# Patient Record
Sex: Female | Born: 1965 | Race: Black or African American | Hispanic: No | Marital: Married | Smoking: Current some day smoker
Health system: Southern US, Community
[De-identification: ages and names within clinical notes are randomized; demographics above are authoritative.]

## PROBLEM LIST (undated history)

## (undated) DIAGNOSIS — E119 Type 2 diabetes mellitus without complications: Secondary | ICD-10-CM

## (undated) DIAGNOSIS — I10 Essential (primary) hypertension: Secondary | ICD-10-CM

---

## 2016-02-03 ENCOUNTER — Emergency Department (HOSPITAL_COMMUNITY)
Admission: EM | Admit: 2016-02-03 | Discharge: 2016-02-03 | Disposition: A | Discharge status: Home / Self Care | Payer: Self-pay | Attending: Emergency Medicine | Admitting: Emergency Medicine

## 2016-02-03 ENCOUNTER — Encounter (HOSPITAL_COMMUNITY): Payer: Self-pay | Admitting: Emergency Medicine

## 2016-02-03 ENCOUNTER — Emergency Department (HOSPITAL_COMMUNITY): Payer: Self-pay

## 2016-02-03 DIAGNOSIS — K8020 Calculus of gallbladder without cholecystitis without obstruction: Secondary | ICD-10-CM

## 2016-02-03 DIAGNOSIS — I10 Essential (primary) hypertension: Secondary | ICD-10-CM | POA: Insufficient documentation

## 2016-02-03 DIAGNOSIS — K439 Ventral hernia without obstruction or gangrene: Secondary | ICD-10-CM

## 2016-02-03 DIAGNOSIS — D259 Leiomyoma of uterus, unspecified: Secondary | ICD-10-CM

## 2016-02-03 DIAGNOSIS — R1030 Lower abdominal pain, unspecified: Secondary | ICD-10-CM

## 2016-02-03 DIAGNOSIS — F17200 Nicotine dependence, unspecified, uncomplicated: Secondary | ICD-10-CM | POA: Insufficient documentation

## 2016-02-03 DIAGNOSIS — Z7982 Long term (current) use of aspirin: Secondary | ICD-10-CM | POA: Insufficient documentation

## 2016-02-03 DIAGNOSIS — N3000 Acute cystitis without hematuria: Secondary | ICD-10-CM

## 2016-02-03 DIAGNOSIS — Z7984 Long term (current) use of oral hypoglycemic drugs: Secondary | ICD-10-CM | POA: Insufficient documentation

## 2016-02-03 DIAGNOSIS — E119 Type 2 diabetes mellitus without complications: Secondary | ICD-10-CM | POA: Insufficient documentation

## 2016-02-03 HISTORY — DX: Essential (primary) hypertension: I10

## 2016-02-03 HISTORY — DX: Type 2 diabetes mellitus without complications: E11.9

## 2016-02-03 LAB — COMPREHENSIVE METABOLIC PANEL
ALBUMIN: 4 g/dL (ref 3.5–5.0)
ALT: 17 U/L (ref 14–54)
AST: 17 U/L (ref 15–41)
Alkaline Phosphatase: 49 U/L (ref 38–126)
Anion gap: 10 (ref 5–15)
BILIRUBIN TOTAL: 0.9 mg/dL (ref 0.3–1.2)
BUN: 15 mg/dL (ref 6–20)
CHLORIDE: 99 mmol/L — AB (ref 101–111)
CO2: 23 mmol/L (ref 22–32)
CREATININE: 0.7 mg/dL (ref 0.44–1.00)
Calcium: 9.2 mg/dL (ref 8.9–10.3)
GFR calc Af Amer: >60 mL/min (ref >60)
GLUCOSE: 181 mg/dL — AB (ref 65–99)
POTASSIUM: 3.5 mmol/L (ref 3.5–5.1)
Sodium: 132 mmol/L — ABNORMAL LOW (ref 135–145)
TOTAL PROTEIN: 7.5 g/dL (ref 6.5–8.1)

## 2016-02-03 LAB — URINALYSIS, ROUTINE W REFLEX MICROSCOPIC
Bilirubin Urine: NEGATIVE
Glucose, UA: NEGATIVE mg/dL
KETONES UR: 5 mg/dL — AB
Nitrite: NEGATIVE
PROTEIN: 100 mg/dL — AB
Specific Gravity, Urine: 1.02 (ref 1.005–1.030)
pH: 6 (ref 5.0–8.0)

## 2016-02-03 LAB — CBC
HEMATOCRIT: 34.4 % — AB (ref 36.0–46.0)
Hemoglobin: 11.6 g/dL — ABNORMAL LOW (ref 12.0–15.0)
MCH: 27.4 pg (ref 26.0–34.0)
MCHC: 33.7 g/dL (ref 30.0–36.0)
MCV: 81.1 fL (ref 78.0–100.0)
Platelets: 268 10*3/uL (ref 150–400)
RBC: 4.24 MIL/uL (ref 3.87–5.11)
RDW: 12.6 % (ref 11.5–15.5)
WBC: 19.5 10*3/uL — AB (ref 4.0–10.5)

## 2016-02-03 LAB — CBG MONITORING, ED: GLUCOSE-CAPILLARY: 190 mg/dL — AB (ref 65–99)

## 2016-02-03 LAB — LIPASE, BLOOD: LIPASE: 16 U/L (ref 11–51)

## 2016-02-03 MED ORDER — ONDANSETRON HCL 4 MG/2ML IJ SOLN
4.0000 mg | Freq: Once | INTRAMUSCULAR | Status: AC
  Administered 2016-02-03: 4 mg via INTRAVENOUS
  Filled 2016-02-03: qty 2

## 2016-02-03 MED ORDER — SODIUM CHLORIDE 0.9 % IJ SOLN
INTRAMUSCULAR | Status: AC
  Administered 2016-02-03: 17:00:00
  Filled 2016-02-03: qty 50

## 2016-02-03 MED ORDER — SODIUM CHLORIDE 0.9 % IV BOLUS (SEPSIS)
1000.0000 mL | Freq: Once | INTRAVENOUS | Status: AC
  Administered 2016-02-03: 1000 mL via INTRAVENOUS

## 2016-02-03 MED ORDER — SULFAMETHOXAZOLE-TRIMETHOPRIM 800-160 MG PO TABS: 1.0000 | ORAL_TABLET | Freq: Two times a day (BID) | ORAL | 0 refills | Status: AC

## 2016-02-03 MED ORDER — HYDROCODONE-ACETAMINOPHEN 5-325 MG PO TABS: 1.0000 | ORAL_TABLET | ORAL | 0 refills | Status: DC | PRN

## 2016-02-03 MED ORDER — SULFAMETHOXAZOLE-TRIMETHOPRIM 800-160 MG PO TABS
1.0000 | ORAL_TABLET | Freq: Once | ORAL | Status: AC
  Administered 2016-02-03: 1 via ORAL
  Filled 2016-02-03: qty 1

## 2016-02-03 MED ORDER — MORPHINE SULFATE (PF) 4 MG/ML IV SOLN
4.0000 mg | Freq: Once | INTRAVENOUS | Status: AC
  Administered 2016-02-03: 4 mg via INTRAVENOUS
  Filled 2016-02-03: qty 1

## 2016-02-03 MED ORDER — IOPAMIDOL (ISOVUE-300) INJECTION 61%
INTRAVENOUS | Status: AC
  Filled 2016-02-03: qty 100

## 2016-02-03 MED ORDER — IOPAMIDOL (ISOVUE-300) INJECTION 61%
100.0000 mL | Freq: Once | INTRAVENOUS | Status: AC | PRN
  Administered 2016-02-03: 100 mL via INTRAVENOUS

## 2016-02-03 MED ORDER — ACETAMINOPHEN 325 MG PO TABS
650.0000 mg | ORAL_TABLET | Freq: Once | ORAL | Status: AC
  Administered 2016-02-03: 650 mg via ORAL
  Filled 2016-02-03: qty 2

## 2016-02-03 NOTE — Progress Notes (Signed)
Pt confirms mark perini as pcp

## 2016-02-03 NOTE — ED Provider Notes (Signed)
[facility] DEPT Provider Note   CSN: [id number] Arrival date & time: 02/03/16  1150     History   Chief Complaint Chief Complaint  Patient presents with  . Cough  . Abdominal Pain    HPI Valerie Cervantes is a 50 y.o. female.  Pt presents to the ED today with abdominal pain.  She said she has pain on both sides of her belly.  She has not had n/v/d.  Last bm yesterday.      Past Medical History:  Diagnosis Date  . Diabetes mellitus without complication ([place])   . Hypertension     There are no active problems to display for this patient.   History reviewed. No pertinent surgical history.   Appendectomy  OB History    No data available       Home Medications    Prior to Admission medications   Medication Sig Start Date End Date Taking? Authorizing Provider  acetaminophen (TYLENOL) 500 MG tablet Take 1,000 mg by mouth every 6 (six) hours as needed for mild pain, moderate pain, fever or headache.   Yes Historical Provider, MD  amLODipine (NORVASC) 5 MG tablet Take 5 mg by mouth daily.   Yes Historical Provider, MD  aspirin EC 81 MG tablet Take 81 mg by mouth daily.   Yes Historical Provider, MD  glimepiride (AMARYL) 4 MG tablet Take 4 mg by mouth daily with breakfast.   Yes Historical Provider, MD  losartan (COZAAR) 50 MG tablet Take 50 mg by mouth daily.   Yes Historical Provider, MD  metFORMIN (GLUCOPHAGE) 1000 MG tablet Take 500 mg by mouth 2 (two) times daily with a meal.   Yes Historical Provider, MD  Multiple Vitamin (MULTIVITAMIN WITH MINERALS) TABS tablet Take 1 tablet by mouth daily.   Yes Historical Provider, MD  Oxycodone HCl 10 MG TABS Take 10 mg by mouth 3 (three) times daily as needed (for pain).   Yes Historical Provider, MD  valsartan (DIOVAN) 160 MG tablet Take 160 mg by mouth daily.   Yes Historical Provider, MD  HYDROcodone-acetaminophen (NORCO/VICODIN) 5-325 MG tablet Take 1 tablet by mouth every 4 (four) hours as needed. 02/03/16   Valerie Pence,  MD  sulfamethoxazole-trimethoprim (BACTRIM DS,SEPTRA DS) 800-160 MG tablet Take 1 tablet by mouth 2 (two) times daily. 02/03/16 02/10/16  Valerie Pence, MD    Family History History reviewed. No pertinent family history.  Social History Social History  Substance Use Topics  . Smoking status: Current Some Day Smoker  . Smokeless tobacco: Not on file  . Alcohol use No     Allergies   Patient has no known allergies.   Review of Systems Review of Systems  Constitutional: Positive for chills and fever.  Gastrointestinal: Positive for abdominal pain.  All other systems reviewed and are negative.    Physical Exam Updated Vital Signs BP 148/98   Pulse 104   Temp 99.4 F (37.4 C) (Oral)   Resp 18   Wt 185 lb (83.9 kg)   LMP 01/17/2016 (Approximate)   SpO2 97%   Physical Exam  Constitutional: She appears well-developed and well-nourished.  HENT:  Head: Normocephalic and atraumatic.  Right Ear: External ear normal.  Left Ear: External ear normal.  Nose: Nose normal.  Mouth/Throat: Oropharynx is clear and moist.  Eyes: Conjunctivae and EOM are normal. Pupils are equal, round, and reactive to light.  Neck: Normal range of motion. Neck supple.  Cardiovascular: Regular rhythm.  Tachycardia present.   Pulmonary/Chest: Effort normal  and breath sounds normal.  Abdominal: Soft. Bowel sounds are normal. There is tenderness in the right lower quadrant and left lower quadrant.  Musculoskeletal: Normal range of motion.  Neurological: She is alert.  Skin: Skin is warm.  Psychiatric: She has a normal mood and affect. Her behavior is normal. Thought content normal.  Nursing note and vitals reviewed.    ED Treatments / Results  Labs (all labs ordered are listed, but only abnormal results are displayed) Labs Reviewed  CBC - Abnormal; Notable for the following:       Result Value   WBC 19.5 (*)    Hemoglobin 11.6 (*)    HCT 34.4 (*)    All other components within normal limits   COMPREHENSIVE METABOLIC PANEL - Abnormal; Notable for the following:    Sodium 132 (*)    Chloride 99 (*)    Glucose, Bld 181 (*)    All other components within normal limits  URINALYSIS, ROUTINE W REFLEX MICROSCOPIC - Abnormal; Notable for the following:    APPearance CLOUDY (*)    Hgb urine dipstick SMALL (*)    Ketones, ur 5 (*)    Protein, ur 100 (*)    Leukocytes, UA MODERATE (*)    Bacteria, UA RARE (*)    Squamous Epithelial / LPF 6-30 (*)    All other components within normal limits  CBG MONITORING, ED - Abnormal; Notable for the following:    Glucose-Capillary 190 (*)    All other components within normal limits  LIPASE, BLOOD    EKG  EKG Interpretation  Date/Time:  Thursday February 03 2016 12:28:59 EST Ventricular Rate:  116 PR Interval:    QRS Duration: 68 QT Interval:  308 QTC Calculation: 428 R Axis:   82 Text Interpretation:  Sinus tachycardia Anterior infarct, old Confirmed by [facility] MD, Morrison Masser ([id number]) on 02/03/2016 4:16:29 PM       Radiology Dg Chest 2 View  Result Date: 02/03/2016 CLINICAL DATA:  Cough, chills EXAM: CHEST  2 VIEW COMPARISON:  None. FINDINGS: The heart size and mediastinal contours are within normal limits. Both lungs are clear. The visualized skeletal structures are unremarkable. IMPRESSION: No active cardiopulmonary disease. Electronically Signed   By: Kathreen Devoid   On: 02/03/2016 17:17   Ct Abdomen Pelvis W Contrast  Result Date: 02/03/2016 CLINICAL DATA:  50 y/o  F; lower abdominal pain. EXAM: CT ABDOMEN AND PELVIS WITH CONTRAST TECHNIQUE: Multidetector CT imaging of the abdomen and pelvis was performed using the standard protocol following bolus administration of intravenous contrast. CONTRAST:  16mL ISOVUE-300 IOPAMIDOL (ISOVUE-300) INJECTION 61% COMPARISON:  None. FINDINGS: Lower chest: No acute abnormality. Hepatobiliary: Focal fat along the falciform ligament. Few gallstones measuring up to 5 mm. No intrahepatic ductal  dilatation. Mild prominence of the common bile duct measuring up to 7 mm. Pancreas: Prominent pancreatic duct within normal limits. No surrounding inflammatory changes. Spleen: Normal in size without focal abnormality. Adrenals/Urinary Tract: Adrenal glands are unremarkable. Kidneys are normal, without renal calculi, focal lesion, or hydronephrosis. Bladder is unremarkable. Stomach/Bowel: Stomach is within normal limits. Appendix not visualized. No evidence of bowel wall thickening, distention, or inflammatory changes. Vascular/Lymphatic: No significant vascular findings are present. No enlarged abdominal or pelvic lymph nodes. Reproductive: Multiple uterine myomas the largest in the posterior lower uterine segment sub serosal measuring 35 mm. Adnexa are unremarkable. Other: Broad-based supraumbilical midline abdominal hernia with partial herniation of transverse colon without obstruction or inflammatory change. Broad-based midline infraumbilical anterior abdominal wall hernia  containing multiple loops of small bowel without obstructive or inflammatory change. No ascites. Musculoskeletal: Grade 1 L4-5 degenerative anterolisthesis. No acute osseous abnormality. IMPRESSION: 1. Broad-based small supraumbilical midline abdominal hernia with partial herniation of transverse colon without obstructive or inflammatory change. 2. Broad-based moderate infraumbilical midline abdominal hernia containing loops of small bowel without obstructive or inflammatory change. 3. Gallstones.  No secondary signs of cholecystitis. 4. Myomatous uterus. 5. Lumbar degenerative changes with grade 1 L4-5 anterolisthesis. Electronically Signed   By: Kristine Garbe M.D.   On: 02/03/2016 17:46    Procedures Procedures (including critical care time)  Medications Ordered in ED Medications  sulfamethoxazole-trimethoprim (BACTRIM DS,SEPTRA DS) 800-160 MG per tablet 1 tablet (not administered)  sodium chloride 0.9 % bolus 1,000 mL  (1,000 mLs Intravenous New Bag/Given 02/03/16 1658)  morphine 4 MG/ML injection 4 mg (4 mg Intravenous Given 02/03/16 1658)  ondansetron (ZOFRAN) injection 4 mg (4 mg Intravenous Given 02/03/16 1658)  acetaminophen (TYLENOL) tablet 650 mg (650 mg Oral Given 02/03/16 1658)  iopamidol (ISOVUE-300) 61 % injection 100 mL (100 mLs Intravenous Contrast Given 02/03/16 1710)  sodium chloride 0.9 % injection (  Given by Other 02/03/16 1715)     Initial Impression / Assessment and Plan / ED Course  I have reviewed the triage vital signs and the nursing notes.  Pertinent labs & imaging results that were available during my care of the patient were reviewed by me and considered in my medical decision making (see chart for details).  Clinical Course     Pt is feeling much better.  No pain around hernias.  No pain in RUQ.  Pt ok for d/c.  She knows to return if worse and to f/u with surgery.  Final Clinical Impressions(s) / ED Diagnoses   Final diagnoses:  Lower abdominal pain  Acute cystitis without hematuria  Calculus of gallbladder without cholecystitis without obstruction  Uterine leiomyoma, unspecified location  Ventral hernia without obstruction or gangrene    New Prescriptions New Prescriptions   HYDROCODONE-ACETAMINOPHEN (NORCO/VICODIN) 5-325 MG TABLET    Take 1 tablet by mouth every 4 (four) hours as needed.   SULFAMETHOXAZOLE-TRIMETHOPRIM (BACTRIM DS,SEPTRA DS) 800-160 MG TABLET    Take 1 tablet by mouth 2 (two) times daily.     Valerie Pence, MD 02/03/16 1800

## 2016-02-03 NOTE — ED Triage Notes (Addendum)
Pt reports cough, chills, and intermittent dizziness. Pt also having bilateral lower abd pain. No n/vd, dysuria, sore throat, runny nose, or CP. Has not taken any tylenol or ibuprofen this am.

## 2016-02-03 NOTE — ED Notes (Signed)
ED Provider at bedside. 

## 2016-02-03 NOTE — ED Notes (Signed)
Patient transported to X-ray 

## 2016-02-03 NOTE — ED Notes (Signed)
Patient is resting comfortably. 

## 2016-03-29 ENCOUNTER — Encounter (HOSPITAL_COMMUNITY): Payer: Self-pay

## 2016-03-29 ENCOUNTER — Emergency Department (HOSPITAL_COMMUNITY)
Admission: EM | Admit: 2016-03-29 | Discharge: 2016-03-29 | Disposition: A | Discharge status: Home / Self Care | Payer: Self-pay | Attending: Physician Assistant | Admitting: Physician Assistant

## 2016-03-29 DIAGNOSIS — Z7982 Long term (current) use of aspirin: Secondary | ICD-10-CM | POA: Insufficient documentation

## 2016-03-29 DIAGNOSIS — Z79899 Other long term (current) drug therapy: Secondary | ICD-10-CM | POA: Insufficient documentation

## 2016-03-29 DIAGNOSIS — E119 Type 2 diabetes mellitus without complications: Secondary | ICD-10-CM | POA: Insufficient documentation

## 2016-03-29 DIAGNOSIS — I10 Essential (primary) hypertension: Secondary | ICD-10-CM | POA: Insufficient documentation

## 2016-03-29 DIAGNOSIS — F17200 Nicotine dependence, unspecified, uncomplicated: Secondary | ICD-10-CM | POA: Insufficient documentation

## 2016-03-29 DIAGNOSIS — K2970 Gastritis, unspecified, without bleeding: Secondary | ICD-10-CM | POA: Insufficient documentation

## 2016-03-29 LAB — URINALYSIS, ROUTINE W REFLEX MICROSCOPIC
BILIRUBIN URINE: NEGATIVE
Bacteria, UA: NONE SEEN
Glucose, UA: NEGATIVE mg/dL
Hgb urine dipstick: NEGATIVE
KETONES UR: NEGATIVE mg/dL
Leukocytes, UA: NEGATIVE
Nitrite: NEGATIVE
PH: 8 (ref 5.0–8.0)
Protein, ur: 100 mg/dL — AB
SPECIFIC GRAVITY, URINE: 1.019 (ref 1.005–1.030)

## 2016-03-29 LAB — COMPREHENSIVE METABOLIC PANEL
ALT: 14 U/L (ref 14–54)
AST: 18 U/L (ref 15–41)
Albumin: 3.8 g/dL (ref 3.5–5.0)
Alkaline Phosphatase: 62 U/L (ref 38–126)
Anion gap: 12 (ref 5–15)
BUN: 7 mg/dL (ref 6–20)
CHLORIDE: 98 mmol/L — AB (ref 101–111)
CO2: 25 mmol/L (ref 22–32)
CREATININE: 0.55 mg/dL (ref 0.44–1.00)
Calcium: 9.7 mg/dL (ref 8.9–10.3)
GFR calc Af Amer: >60 mL/min (ref >60)
GFR calc non Af Amer: >60 mL/min (ref >60)
Glucose, Bld: 157 mg/dL — ABNORMAL HIGH (ref 65–99)
POTASSIUM: 4 mmol/L (ref 3.5–5.1)
SODIUM: 135 mmol/L (ref 135–145)
Total Bilirubin: 0.2 mg/dL — ABNORMAL LOW (ref 0.3–1.2)
Total Protein: 8 g/dL (ref 6.5–8.1)

## 2016-03-29 LAB — CBC
HCT: 38.7 % (ref 36.0–46.0)
Hemoglobin: 12.8 g/dL (ref 12.0–15.0)
MCH: 27.1 pg (ref 26.0–34.0)
MCHC: 33.1 g/dL (ref 30.0–36.0)
MCV: 82 fL (ref 78.0–100.0)
PLATELETS: 269 10*3/uL (ref 150–400)
RBC: 4.72 MIL/uL (ref 3.87–5.11)
RDW: 12.8 % (ref 11.5–15.5)
WBC: 6 10*3/uL (ref 4.0–10.5)

## 2016-03-29 LAB — POC URINE PREG, ED: PREG TEST UR: NEGATIVE

## 2016-03-29 LAB — LIPASE, BLOOD: LIPASE: 29 U/L (ref 11–51)

## 2016-03-29 MED ORDER — GI COCKTAIL ~~LOC~~
30.0000 mL | Freq: Once | ORAL | Status: AC
  Administered 2016-03-29: 30 mL via ORAL
  Filled 2016-03-29: qty 30

## 2016-03-29 MED ORDER — OMEPRAZOLE 20 MG PO CPDR: 20.0000 mg | DELAYED_RELEASE_CAPSULE | Freq: Every day | ORAL | 0 refills | Status: AC

## 2016-03-29 NOTE — ED Notes (Signed)
Pt ambulated to restroom from room, tolerated well. 

## 2016-03-29 NOTE — Discharge Instructions (Signed)
Please take your medications as prescribed. Avoid spicy foods, follow-up with your doctor for further evaluation and management of symptoms. Return to ED for new or worsening symptoms as we discussed

## 2016-03-29 NOTE — ED Triage Notes (Signed)
Pt presents with onset of L sided abdominal pain that began today.  +nausea and vomiting, denies any dysuria or vaginal discharge.

## 2016-03-29 NOTE — ED Provider Notes (Signed)
[facility] DEPT Provider Note   CSN: [id number] Arrival date & time: 03/29/16  1412     History   Chief Complaint Chief Complaint  Patient presents with  . Abdominal Pain    HPI Valerie Cervantes is a 51 y.o. female.  HPI history of diabetes and hypertension here for valuation of left-sided abdominal pain. Patient reports gradual onset left-sided abdominal pain that started this morning. She reports it feels like "labor pains". She reports 1 episode of nonbloody and nonbilious emesis. She did move her bowels today without difficulty. she denies any fevers, chills, chest pain, shortness of breath, back pain. Nothing tried to improve symptoms.   Past Medical History:  Diagnosis Date  . Diabetes mellitus without complication ([place])   . Hypertension     There are no active problems to display for this patient.   History reviewed. No pertinent surgical history.  OB History    No data available       Home Medications    Prior to Admission medications   Medication Sig Start Date End Date Taking? Authorizing Provider  amLODipine (NORVASC) 5 MG tablet Take 5 mg by mouth daily.   Yes Historical Provider, MD  aspirin EC 81 MG tablet Take 81 mg by mouth daily.   Yes Historical Provider, MD  Cholecalciferol 2000 units TABS Take 2,000 Units by mouth daily.   Yes Historical Provider, MD  glimepiride (AMARYL) 4 MG tablet Take 4 mg by mouth 2 (two) times daily.    Yes Historical Provider, MD  lisinopril (PRINIVIL,ZESTRIL) 20 MG tablet Take 20 mg by mouth daily.   Yes Historical Provider, MD  metFORMIN (GLUCOPHAGE) 1000 MG tablet Take 500 mg by mouth 2 (two) times daily with a meal.   Yes Historical Provider, MD  Omega-3 Fatty Acids (FISH OIL) 1000 MG CAPS Take 1,000 mg by mouth daily.   Yes Historical Provider, MD  Oxycodone HCl 10 MG TABS Take 10 mg by mouth 3 (three) times daily as needed (pain).   Yes Historical Provider, MD  omeprazole (PRILOSEC) 20 MG capsule Take 1 capsule (20 mg  total) by mouth daily. 03/29/16   Comer Locket, PA-C    Family History History reviewed. No pertinent family history.  Social History Social History  Substance Use Topics  . Smoking status: Current Some Day Smoker  . Smokeless tobacco: Never Used  . Alcohol use No     Allergies   Patient has no known allergies.   Review of Systems Review of Systems A 10 point review of systems was completed and was negative except for pertinent positives and negatives as mentioned in the history of present illness    Physical Exam Updated Vital Signs BP (!) 146/102 (BP Location: Right Arm)   Pulse 90   Temp 98.8 F (37.1 C) (Oral)   Resp 18   Ht 5\' 7"  (1.702 m)   Wt 81.6 kg   LMP 03/15/2016 (Approximate)   SpO2 99%   BMI 28.19 kg/m   Physical Exam  Constitutional: She appears well-developed. No distress.  Awake, alert and nontoxic in appearance  HENT:  Head: Normocephalic and atraumatic.  Right Ear: External ear normal.  Left Ear: External ear normal.  Mouth/Throat: Oropharynx is clear and moist.  Eyes: Conjunctivae and EOM are normal. Pupils are equal, round, and reactive to light.  Neck: Normal range of motion. No JVD present.  Cardiovascular: Normal rate, regular rhythm and normal heart sounds.   Pulmonary/Chest: Effort normal and breath sounds normal. No  stridor.  Abdominal: Soft. She exhibits no distension and no mass. There is no rebound and no guarding. No hernia.  Very mild tenderness in left upper quadrant  Musculoskeletal: Normal range of motion.  Neurological:  Awake, alert, cooperative and aware of situation; motor strength bilaterally; sensation normal to light touch bilaterally; no facial asymmetry; tongue midline; major cranial nerves appear intact;  baseline gait without new ataxia.  Skin: No rash noted. She is not diaphoretic.  Psychiatric: She has a normal mood and affect. Her behavior is normal. Thought content normal.  Nursing note and vitals  reviewed.    ED Treatments / Results  Labs (all labs ordered are listed, but only abnormal results are displayed) Labs Reviewed  COMPREHENSIVE METABOLIC PANEL - Abnormal; Notable for the following:       Result Value   Chloride 98 (*)    Glucose, Bld 157 (*)    Total Bilirubin 0.2 (*)    All other components within normal limits  URINALYSIS, ROUTINE W REFLEX MICROSCOPIC - Abnormal; Notable for the following:    Protein, ur 100 (*)    Squamous Epithelial / LPF 0-5 (*)    All other components within normal limits  LIPASE, BLOOD  CBC  POC URINE PREG, ED    EKG  EKG Interpretation  Date/Time:  Wednesday March 29 2016 16:59:39 EST Ventricular Rate:  93 PR Interval:    QRS Duration: 71 QT Interval:  343 QTC Calculation: 427 R Axis:   55 Text Interpretation:  Sinus rhythm Anteroseptal infarct, age indeterminate Normal sinus rhythm Confirmed by Gerald Leitz ([id number]) on 03/29/2016 5:12:19 PM       Radiology No results found.  Procedures Procedures (including critical care time)  Medications Ordered in ED Medications  gi cocktail (Maalox,Lidocaine,Donnatal) (30 mLs Oral Given 03/29/16 1656)     Initial Impression / Assessment and Plan / ED Course  I have reviewed the triage vital signs and the nursing notes.  Pertinent labs & imaging results that were available during my care of the patient were reviewed by me and considered in my medical decision making (see chart for details).     Labs, EKG and physical exam are all reassuring. Given GI cocktail with relief of symptoms. Husband at bedside reports that she eats spicy foods "all day". Suspect some level of gastritis. Counseled on diet. We'll discharge with PPI. Follow-up with PCP. Overall appears very well, nontoxic and appropriate for outpatient follow-up. Return precautions discussed Prior to patient discharge, I discussed and reviewed this case with Dr. Thomasene Lot      Final Clinical Impressions(s) / ED  Diagnoses   Final diagnoses:  Gastritis without bleeding, unspecified chronicity, unspecified gastritis type    New Prescriptions New Prescriptions   OMEPRAZOLE (PRILOSEC) 20 MG CAPSULE    Take 1 capsule (20 mg total) by mouth daily.     Comer Locket, PA-C 03/29/16 [place], MD 04/03/16 [phone]

## 2016-03-29 NOTE — ED Notes (Signed)
Wheeled pt back to room from waiting room. Pt placed in gown and on monitor.

## 2017-06-25 IMAGING — CT CT ABD-PELV W/ CM
2 of 5 series · 16 of 46 positions shown, 18 images · IV contrast (ISOVUE)
Comparison: None.

CLINICAL DATA: 49 y/o  F; lower abdominal pain.

EXAM:
CT ABDOMEN AND PELVIS WITH CONTRAST
TECHNIQUE: Multidetector CT imaging of the abdomen and pelvis was performed
using the standard protocol following bolus administration of
intravenous contrast.
CONTRAST:  100mL 2JALNQ-NFF IOPAMIDOL (2JALNQ-NFF) INJECTION 61%

[Series 2: abd/pel with · axial · 0.75mm/px · z∈[-941,-521]mm · 13 of 96 slices shown, 15 images]
[im 6/96  soft-tissue]
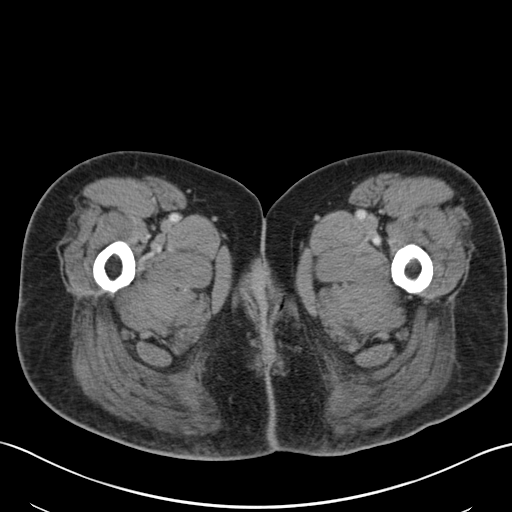
[im 6/96  bone]
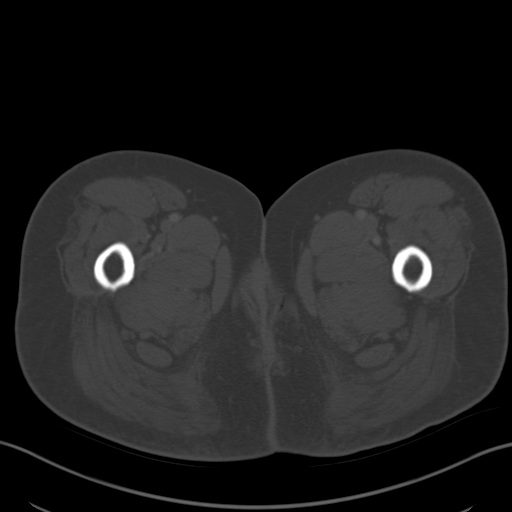
[im 12/96  soft-tissue]
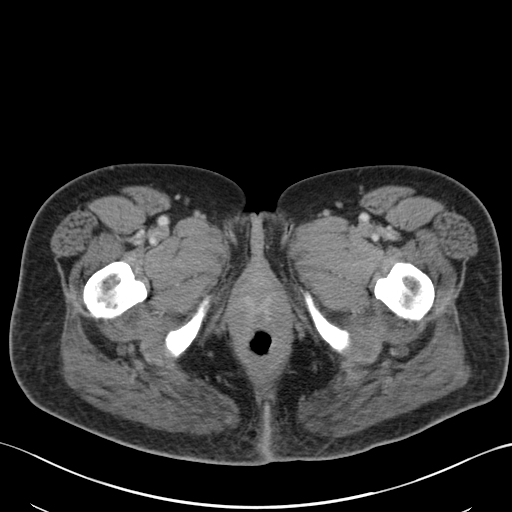
[im 18/96  soft-tissue]
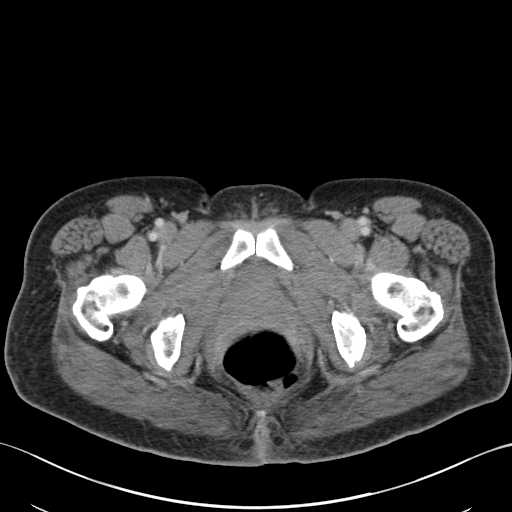
[im 30/96  soft-tissue]
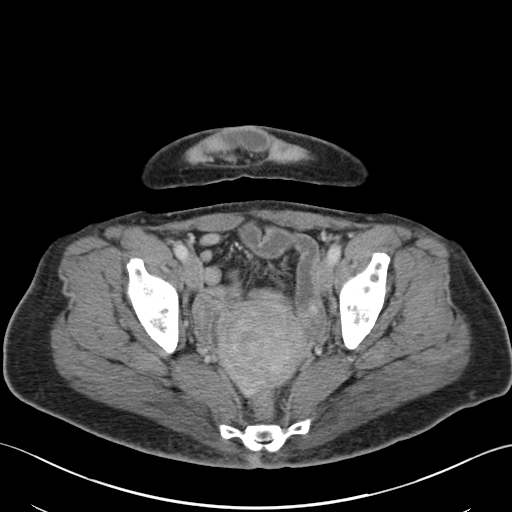
[im 36/96  soft-tissue]
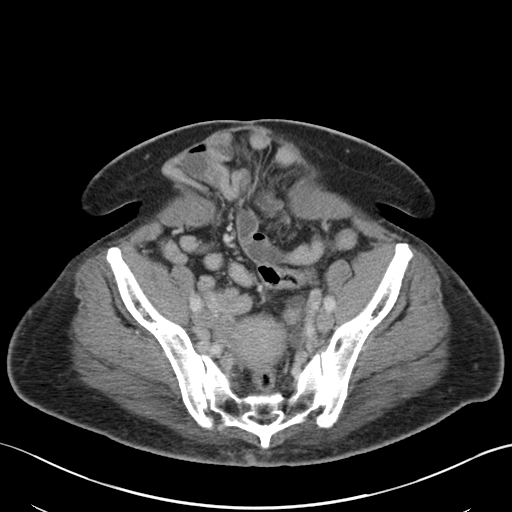
[im 42/96  soft-tissue]
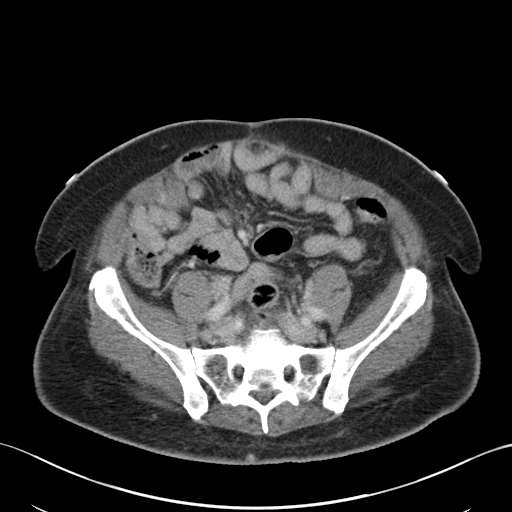
[im 48/96  soft-tissue]
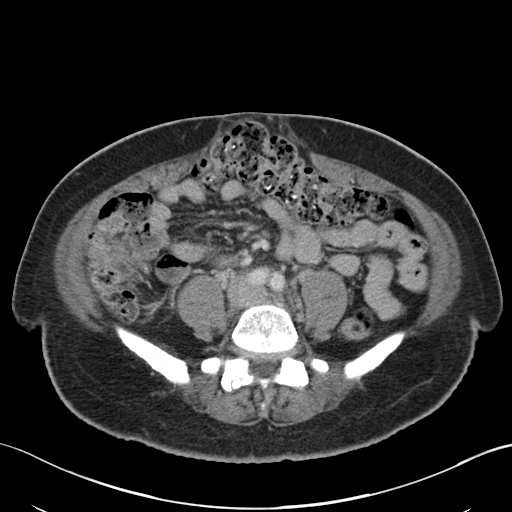
[im 54/96  soft-tissue]
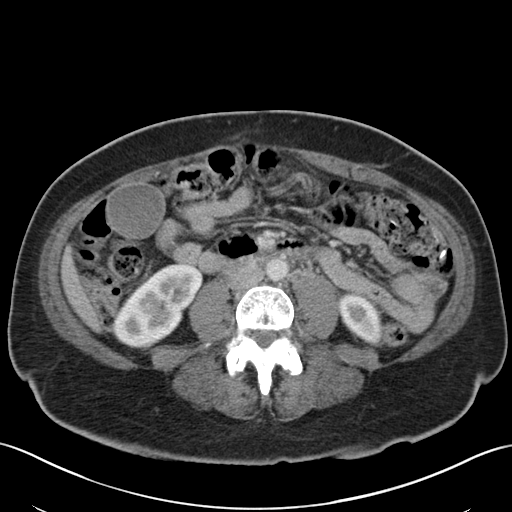
[im 60/96  soft-tissue]
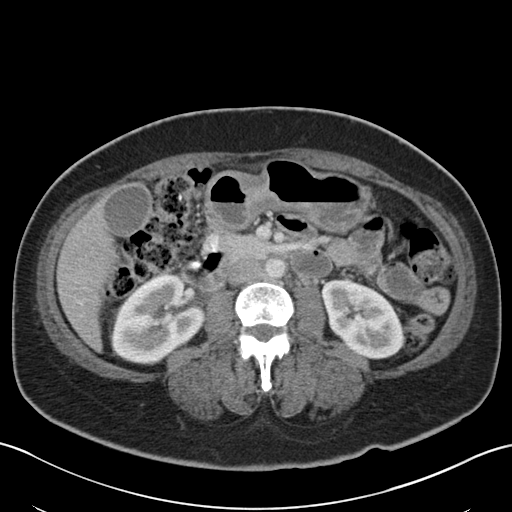
[im 60/96  bone]
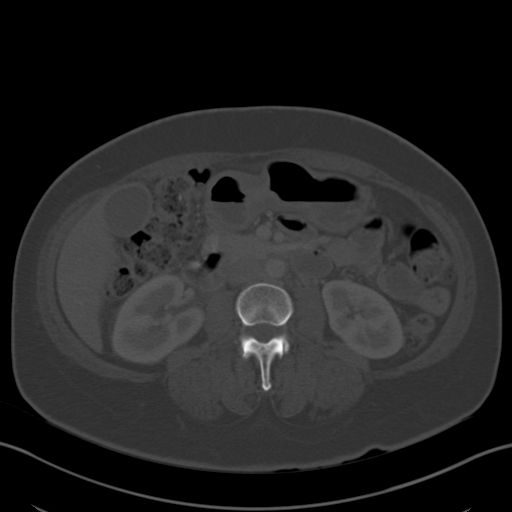
[im 66/96  soft-tissue]
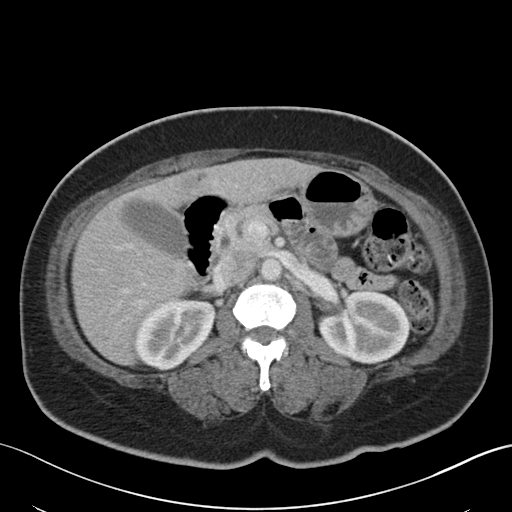
[im 78/96  soft-tissue]
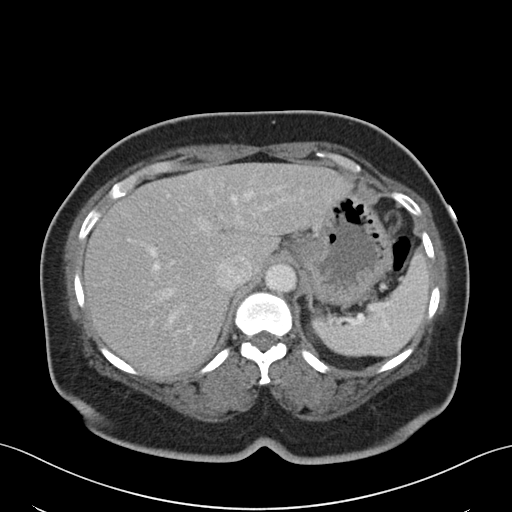
[im 84/96  soft-tissue]
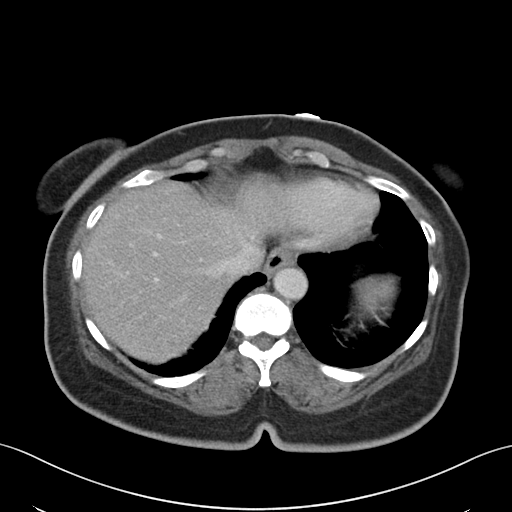
[im 90/96  soft-tissue]
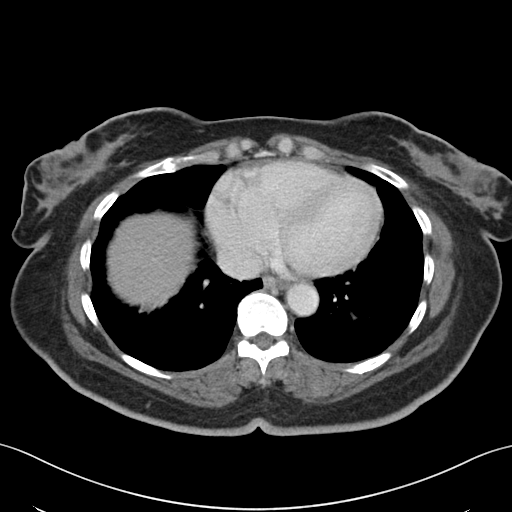

[Series 3: coronal a/|p · coronal · 0.74mm/px · 3 of 134 slices shown]
[im 45/134  soft-tissue]
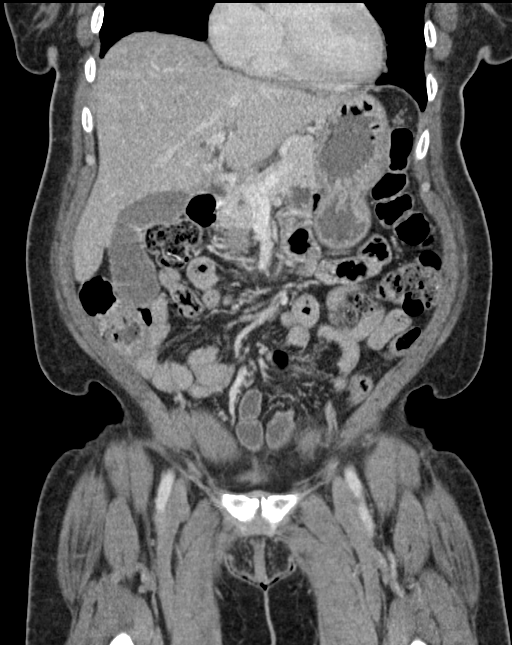
[im 60/134  soft-tissue]
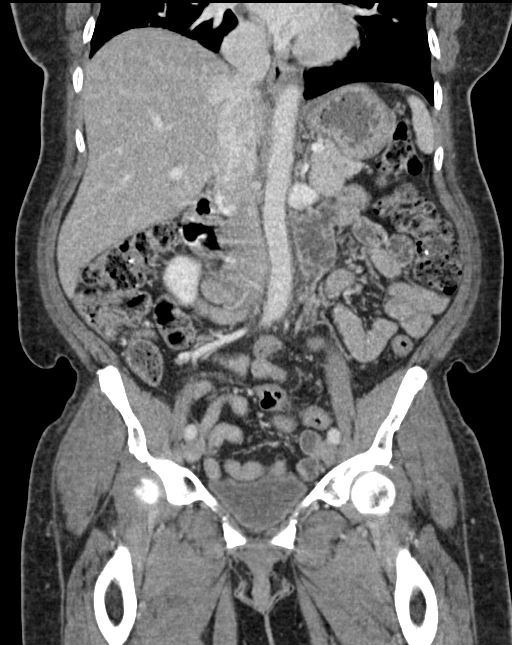
[im 74/134  soft-tissue]
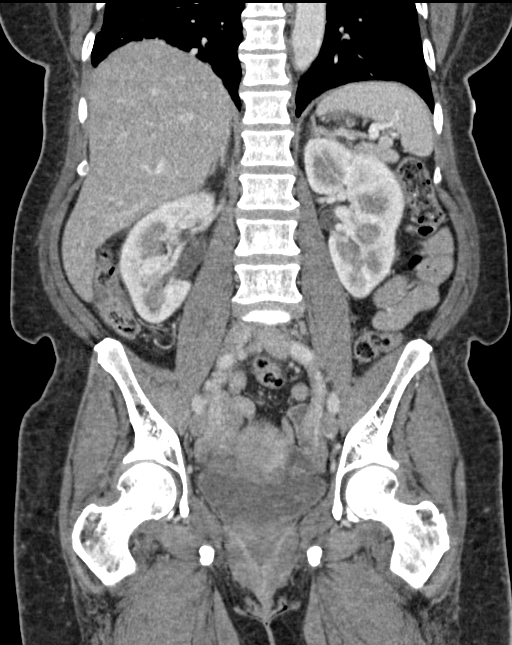

[16 of 46 positions shown; findings below may reference images not displayed]

FINDINGS: Lower chest: No acute abnormality.

Hepatobiliary: Focal fat along the falciform ligament. Few
gallstones measuring up to 5 mm. No intrahepatic ductal dilatation.
Mild prominence of the common bile duct measuring up to 7 mm.

Pancreas: Prominent pancreatic duct within normal limits. No
surrounding inflammatory changes.

Spleen: Normal in size without focal abnormality.

Adrenals/Urinary Tract: Adrenal glands are unremarkable. Kidneys are
normal, without renal calculi, focal lesion, or hydronephrosis.
Bladder is unremarkable.

Stomach/Bowel: Stomach is within normal limits. Appendix not
visualized. No evidence of bowel wall thickening, distention, or
inflammatory changes.

Vascular/Lymphatic: No significant vascular findings are present. No
enlarged abdominal or pelvic lymph nodes.

Reproductive: Multiple uterine myomas the largest in the posterior
lower uterine segment sub serosal measuring 35 mm. Adnexa are
unremarkable.

Other: Broad-based supraumbilical midline abdominal hernia with
partial herniation of transverse colon without obstruction or
inflammatory change. Broad-based midline infraumbilical anterior
abdominal wall hernia containing multiple loops of small bowel
without obstructive or inflammatory change. No ascites.

Musculoskeletal: Grade 1 L4-5 degenerative anterolisthesis. No acute
osseous abnormality.
IMPRESSION: 1. Broad-based small supraumbilical midline abdominal hernia with
partial herniation of transverse colon without obstructive or
inflammatory change.
2. Broad-based moderate infraumbilical midline abdominal hernia
containing loops of small bowel without obstructive or inflammatory
change.
3. Gallstones.  No secondary signs of cholecystitis.
4. Myomatous uterus.
5. Lumbar degenerative changes with grade 1 L4-5 anterolisthesis.

By: Natalija Ir Gediminas Naginiene M.D.

## 2017-06-25 IMAGING — CR DG CHEST 2V
2 series · 2 of 2 positions shown · non-contrast
Comparison: None.

CLINICAL DATA: Cough, chills

EXAM:
CHEST  2 VIEW

[w chest pa]
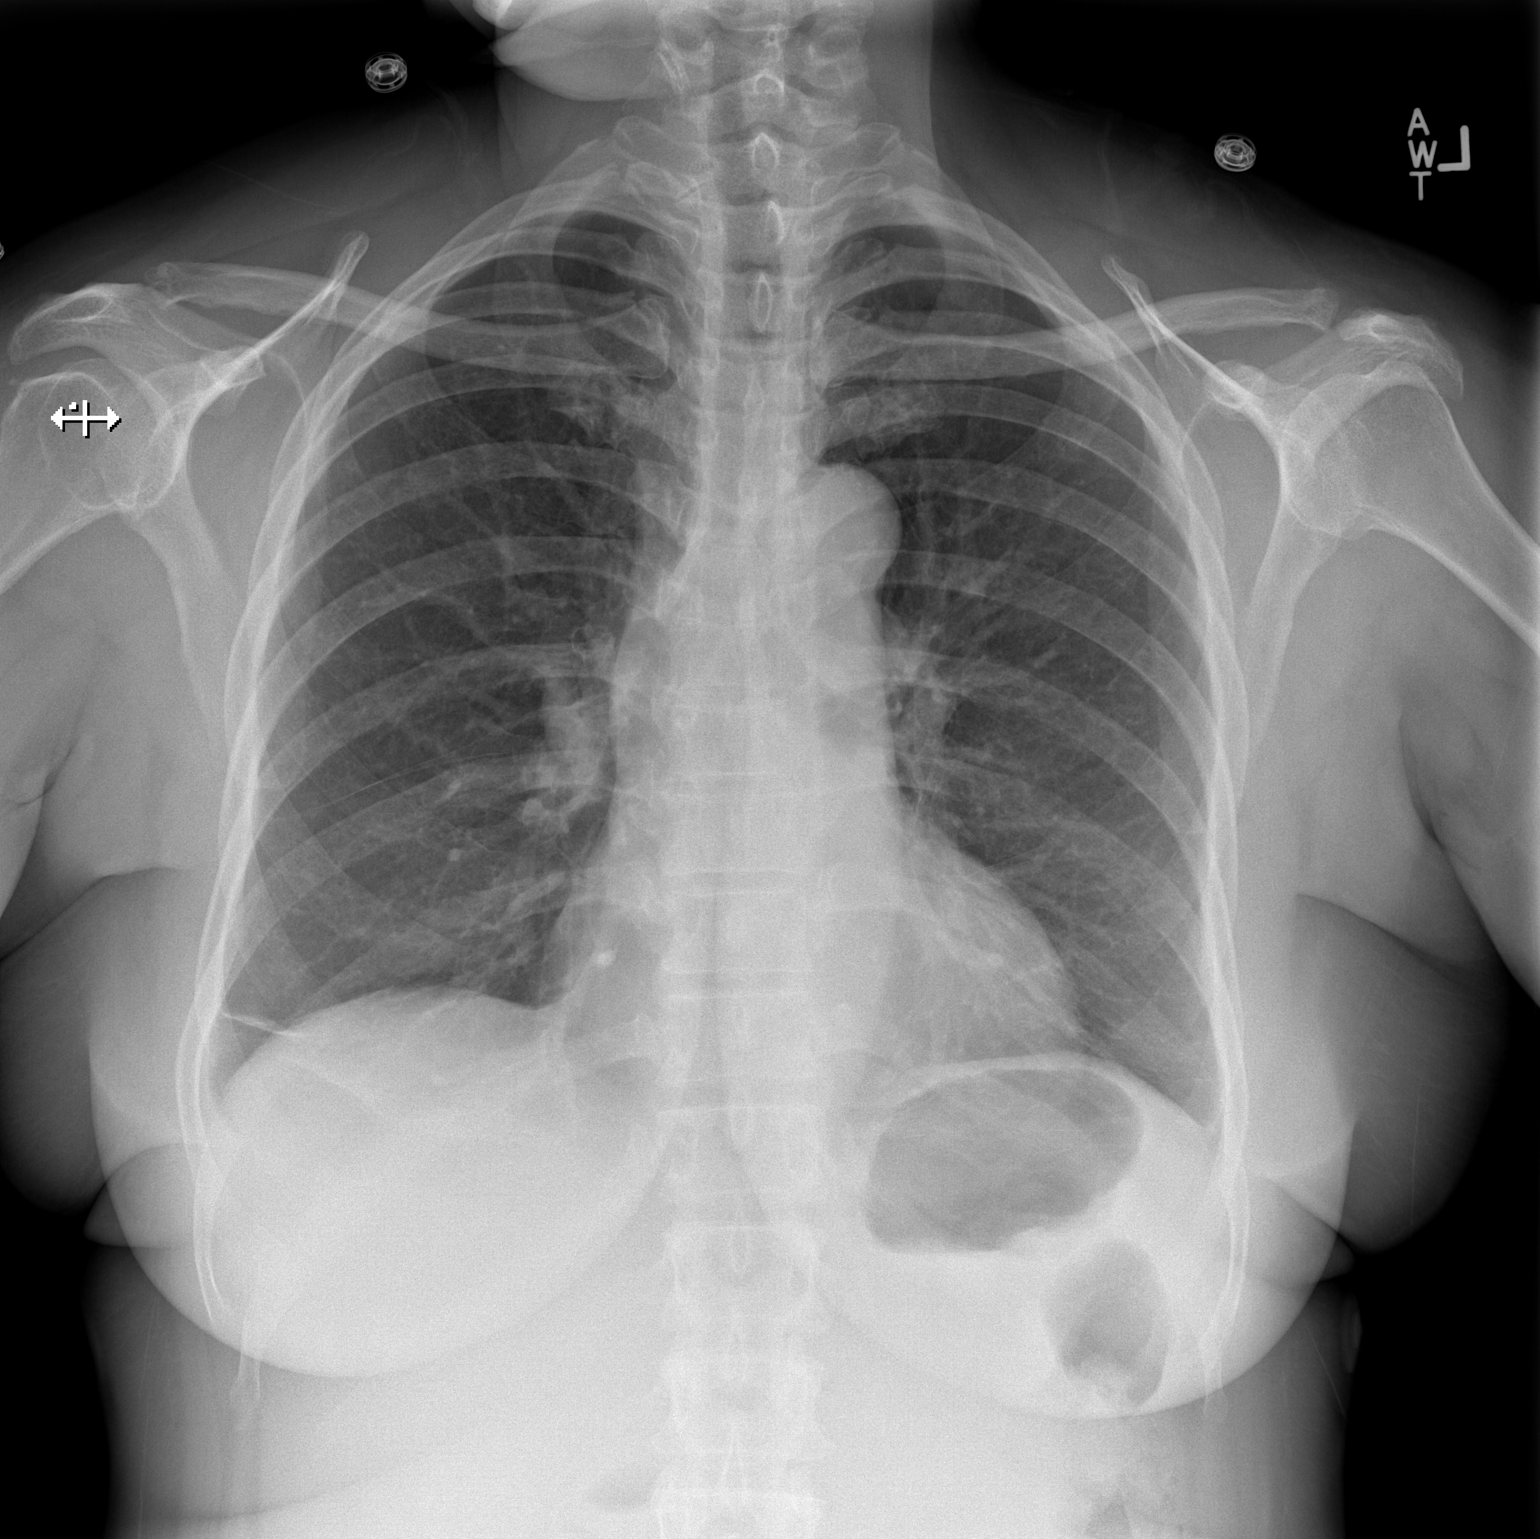

[w chest lat]
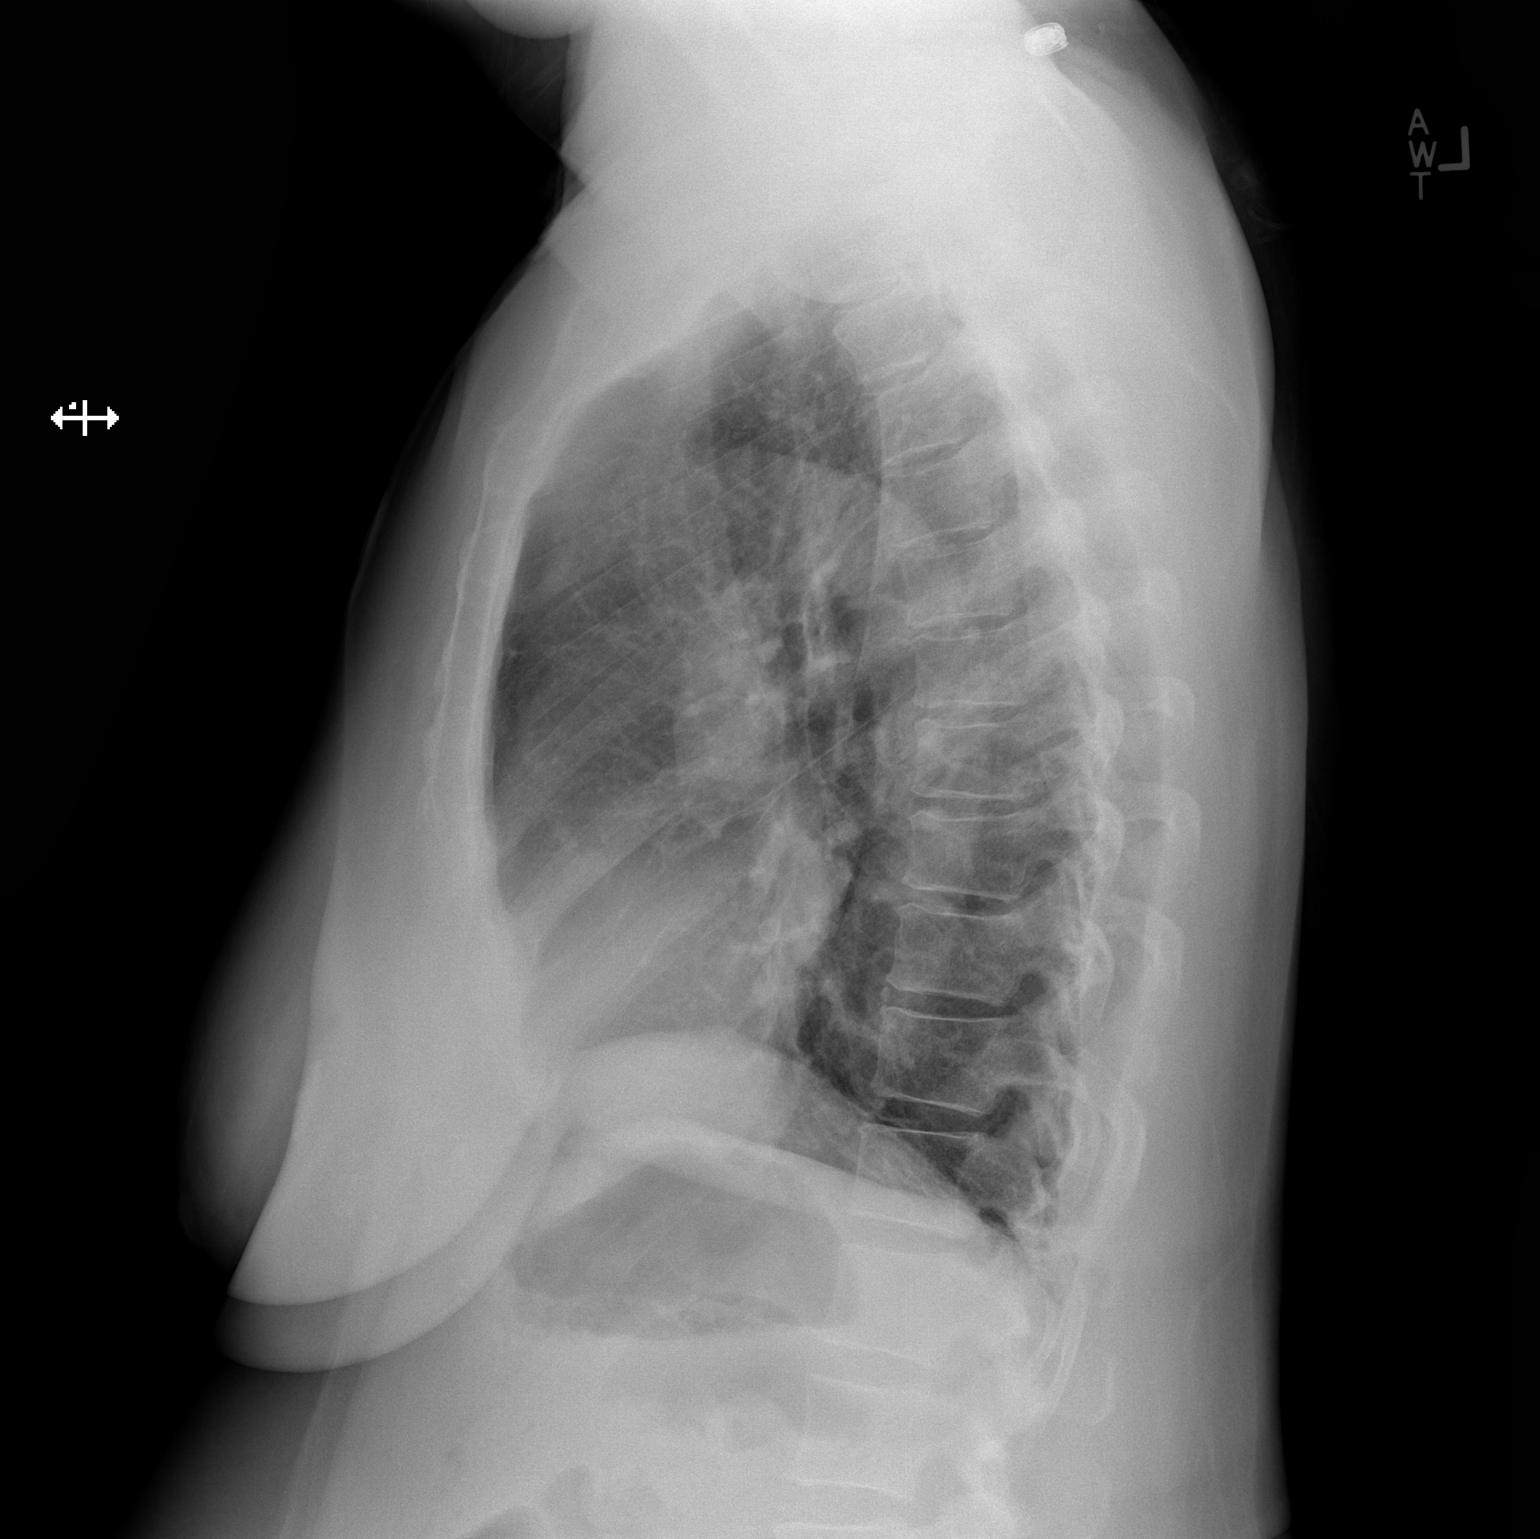

[2 of 2 positions shown; findings below may reference images not displayed]

FINDINGS: The heart size and mediastinal contours are within normal limits.
Both lungs are clear. The visualized skeletal structures are
unremarkable.
IMPRESSION: No active cardiopulmonary disease.

## 2017-09-19 ENCOUNTER — Other Ambulatory Visit: Payer: Self-pay | Admitting: Internal Medicine

## 2017-09-19 DIAGNOSIS — R05 Cough: Secondary | ICD-10-CM

## 2017-09-19 DIAGNOSIS — R590 Localized enlarged lymph nodes: Secondary | ICD-10-CM

## 2017-09-19 DIAGNOSIS — R059 Cough, unspecified: Secondary | ICD-10-CM

## 2022-02-10 ENCOUNTER — Other Ambulatory Visit: Payer: Self-pay | Admitting: Internal Medicine

## 2022-02-10 DIAGNOSIS — E785 Hyperlipidemia, unspecified: Secondary | ICD-10-CM

## 2022-03-02 ENCOUNTER — Other Ambulatory Visit: Payer: Self-pay

## 2022-03-02 ENCOUNTER — Emergency Department (HOSPITAL_BASED_OUTPATIENT_CLINIC_OR_DEPARTMENT_OTHER)
Admission: EM | Admit: 2022-03-02 | Discharge: 2022-03-02 | Disposition: A | Discharge status: Home / Self Care | Payer: 59 | Attending: Emergency Medicine | Admitting: Emergency Medicine

## 2022-03-02 ENCOUNTER — Encounter (HOSPITAL_BASED_OUTPATIENT_CLINIC_OR_DEPARTMENT_OTHER): Payer: Self-pay | Admitting: Emergency Medicine

## 2022-03-02 ENCOUNTER — Other Ambulatory Visit (HOSPITAL_BASED_OUTPATIENT_CLINIC_OR_DEPARTMENT_OTHER): Payer: Self-pay

## 2022-03-02 DIAGNOSIS — I10 Essential (primary) hypertension: Secondary | ICD-10-CM | POA: Insufficient documentation

## 2022-03-02 DIAGNOSIS — Z7984 Long term (current) use of oral hypoglycemic drugs: Secondary | ICD-10-CM | POA: Diagnosis not present

## 2022-03-02 DIAGNOSIS — J101 Influenza due to other identified influenza virus with other respiratory manifestations: Secondary | ICD-10-CM

## 2022-03-02 DIAGNOSIS — E119 Type 2 diabetes mellitus without complications: Secondary | ICD-10-CM | POA: Insufficient documentation

## 2022-03-02 DIAGNOSIS — Z20822 Contact with and (suspected) exposure to covid-19: Secondary | ICD-10-CM | POA: Insufficient documentation

## 2022-03-02 DIAGNOSIS — Z79899 Other long term (current) drug therapy: Secondary | ICD-10-CM | POA: Insufficient documentation

## 2022-03-02 DIAGNOSIS — Z7982 Long term (current) use of aspirin: Secondary | ICD-10-CM | POA: Insufficient documentation

## 2022-03-02 DIAGNOSIS — R059 Cough, unspecified: Secondary | ICD-10-CM | POA: Diagnosis present

## 2022-03-02 LAB — RESP PANEL BY RT-PCR (RSV, FLU A&B, COVID)  RVPGX2
Influenza A by PCR: POSITIVE — AB
Influenza B by PCR: NEGATIVE
Resp Syncytial Virus by PCR: NEGATIVE
SARS Coronavirus 2 by RT PCR: NEGATIVE

## 2022-03-02 MED ORDER — OSELTAMIVIR PHOSPHATE 75 MG PO CAPS
75.0000 mg | ORAL_CAPSULE | Freq: Two times a day (BID) | ORAL | 0 refills | Status: AC
  Filled 2022-03-02: qty 10, 5d supply, fill #0

## 2022-03-02 MED ORDER — BENZONATATE 100 MG PO CAPS
100.0000 mg | ORAL_CAPSULE | Freq: Three times a day (TID) | ORAL | 0 refills | Status: AC
  Filled 2022-03-02: qty 21, 7d supply, fill #0

## 2022-03-02 NOTE — ED Triage Notes (Signed)
Body aches, headache, cough, nasal congestion since Tuesday.

## 2022-03-02 NOTE — ED Provider Notes (Signed)
[facility] EMERGENCY DEPARTMENT Provider Note   CSN: [id number] Arrival date & time: 03/02/22  0703     History  Chief Complaint  Patient presents with   URI    Valerie Cervantes is a 57 y.o. female.  HPI     57 year old female with a history of diabetes and hypertension presents with concern for 2 days of cough, congestion and chills.  2 days cough, congestion, chills No n/v/d Fatigue going up stairs  Hx of DM  Home Medications Prior to Admission medications   Medication Sig Start Date End Date Taking? Authorizing Provider  benzonatate (TESSALON) 100 MG capsule Take 1 capsule (100 mg total) by mouth every 8 (eight) hours. 03/02/22  Yes Gareth Morgan, MD  oseltamivir (TAMIFLU) 75 MG capsule Take 1 capsule (75 mg total) by mouth every 12 (twelve) hours. 03/02/22  Yes Gareth Morgan, MD  amLODipine (NORVASC) 5 MG tablet Take 5 mg by mouth daily.    [provider]  aspirin EC 81 MG tablet Take 81 mg by mouth daily.    [provider]  Cholecalciferol 2000 units TABS Take 2,000 Units by mouth daily.    [provider]  glimepiride (AMARYL) 4 MG tablet Take 4 mg by mouth 2 (two) times daily.     [provider]  lisinopril (PRINIVIL,ZESTRIL) 20 MG tablet Take 20 mg by mouth daily.    [provider]  metFORMIN (GLUCOPHAGE) 1000 MG tablet Take 500 mg by mouth 2 (two) times daily with a meal.    [provider]  Omega-3 Fatty Acids (FISH OIL) 1000 MG CAPS Take 1,000 mg by mouth daily.    [provider]  omeprazole (PRILOSEC) 20 MG capsule Take 1 capsule (20 mg total) by mouth daily. 03/29/16   Cartner, Marland Kitchen, PA-C  Oxycodone HCl 10 MG TABS Take 10 mg by mouth 3 (three) times daily as needed (pain).    [provider]      Allergies    Patient has no known allergies.    Review of Systems   Review of Systems  Physical Exam Updated Vital Signs BP 112/71   Pulse 97   Temp 99 F (37.2 C)  (Oral)   Resp 20   Ht '5\' 8"'$  (1.727 m)   Wt 102.1 kg   SpO2 92%   BMI 34.21 kg/m  Physical Exam Vitals and nursing note reviewed.  Constitutional:      General: She is not in acute distress.    Appearance: She is well-developed. She is not diaphoretic.  HENT:     Head: Normocephalic and atraumatic.  Eyes:     Conjunctiva/sclera: Conjunctivae normal.  Cardiovascular:     Rate and Rhythm: Normal rate and regular rhythm.     Heart sounds: Normal heart sounds. No murmur heard.    No friction rub. No gallop.  Pulmonary:     Effort: Pulmonary effort is normal. No respiratory distress.     Breath sounds: Normal breath sounds. No wheezing or rales.  Abdominal:     General: There is no distension.     Palpations: Abdomen is soft.     Tenderness: There is no abdominal tenderness. There is no guarding.  Musculoskeletal:        General: No tenderness.     Cervical back: Normal range of motion.  Skin:    General: Skin is warm and dry.     Findings: No erythema or rash.  Neurological:     Mental  Status: She is alert and oriented to person, place, and time.     ED Results / Procedures / Treatments   Labs (all labs ordered are listed, but only abnormal results are displayed) Labs Reviewed  RESP PANEL BY RT-PCR (RSV, FLU A&B, COVID)  [id number] - Abnormal; Notable for the following components:      Result Value   Influenza A by PCR POSITIVE (*)    All other components within normal limits    EKG None  Radiology No results found.  Procedures Procedures    Medications Ordered in ED Medications - No data to display  ED Course/ Medical Decision Making/ A&P                            57 year old female with a history of diabetes and hypertension presents with concern for 2 days of cough, congestion and chills.  Influenza testing is positive.  She has clear breath sounds bilaterally, no sign of asthma exacerbation, pneumonia, pulmonary edema by history or exam.  Do not suspect  significant electrolyte abnormalities given no nausea, vomiting or diarrhea.  Given symptoms started within 48 hours, history of diabetes, given prescription for Tamiflu.  Recommend continued supportive care and discussed reasons to return. Patient discharged in stable condition with understanding of reasons to return.         Final Clinical Impression(s) / ED Diagnoses Final diagnoses:  Influenza A    Rx / DC Orders ED Discharge Orders          Ordered    oseltamivir (TAMIFLU) 75 MG capsule  Every 12 hours        03/02/22 0837    benzonatate (TESSALON) 100 MG capsule  Every 8 hours        03/02/22 0837              Gareth Morgan, MD 03/03/22 1050

## 2022-03-02 NOTE — ED Notes (Signed)
Topaz signature pad not working Pt understood discharge

## 2023-11-08 ENCOUNTER — Ambulatory Visit (HOSPITAL_COMMUNITY): Admission: EM | Admit: 2023-11-08 | Discharge: 2023-11-08 | Disposition: A | Discharge status: Home / Self Care

## 2023-11-08 ENCOUNTER — Encounter (HOSPITAL_COMMUNITY): Payer: Self-pay

## 2023-11-08 DIAGNOSIS — R52 Pain, unspecified: Secondary | ICD-10-CM | POA: Diagnosis not present
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
